# Patient Record
Sex: Female | Born: 2000 | Race: White | Hispanic: No | Marital: Single | Smoking: Never smoker
Health system: Southern US, Community
[De-identification: ages and names within clinical notes are randomized; demographics above are authoritative.]

---
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
## 2018-05-07 IMAGING — CT CT ANGIO CHEST
2 of 6 series · 19 of 46 positions shown · IV contrast (Isovue)
Comparison: Prior radiograph from earlier the same day.

CLINICAL DATA: Initial evaluation for acute chest pain radiating to
left arm. Nausea.

EXAM:
CT ANGIOGRAPHY CHEST WITH CONTRAST
TECHNIQUE: Multidetector CT imaging of the chest was performed using the
standard protocol during bolus administration of intravenous
contrast. Multiplanar CT image reconstructions and MIPs were
obtained to evaluate the vascular anatomy.
CONTRAST:  100 cc of Isovue 370.

[Series 5: thins · axial · 0.64mm/px · z∈[-489,-187]mm · 16 of 332 slices shown]
[im 15/332  lung]
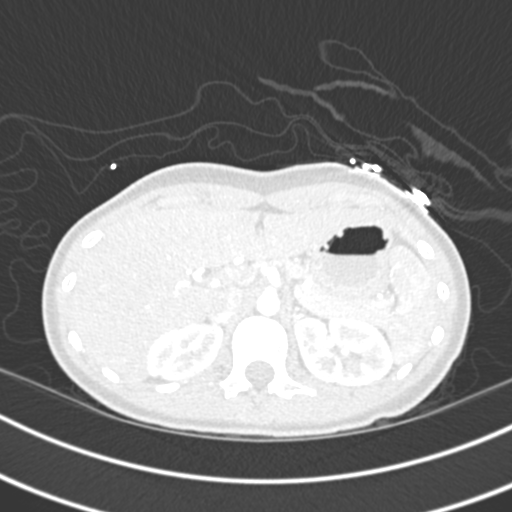
[im 44/332  soft-tissue]
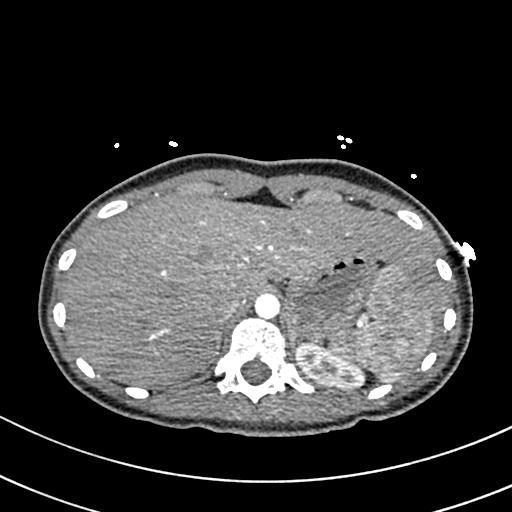
[im 58/332  lung]
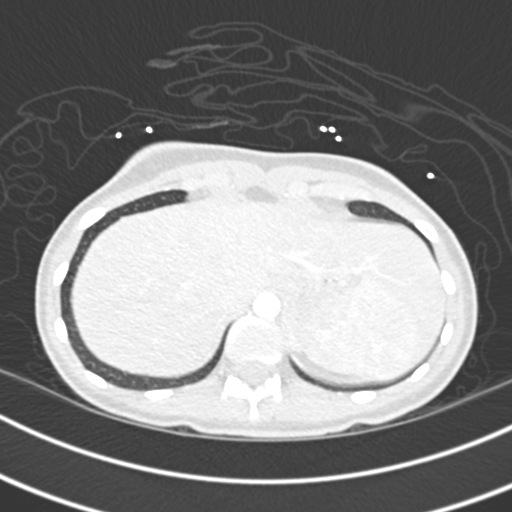
[im 72/332  soft-tissue]
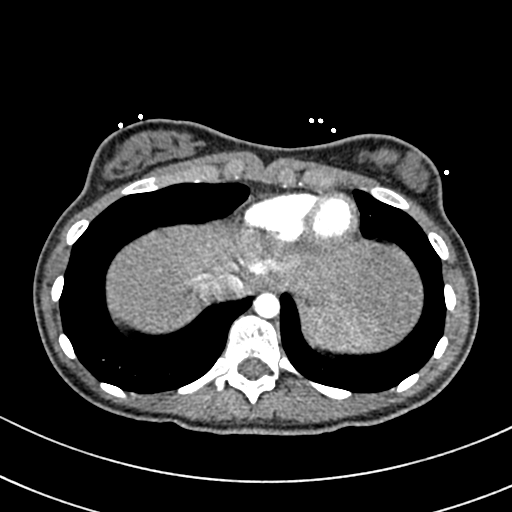
[im 101/332  lung]
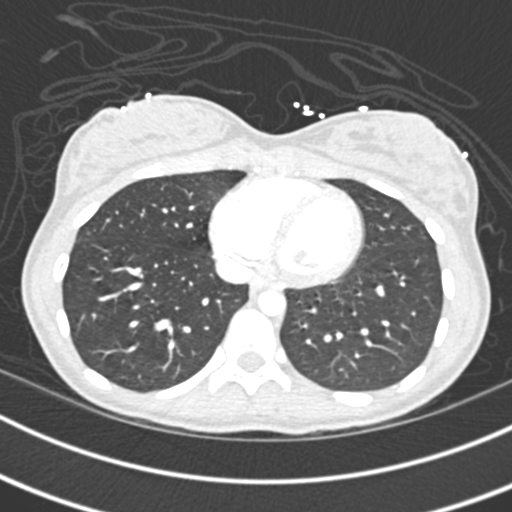
[im 116/332  soft-tissue]
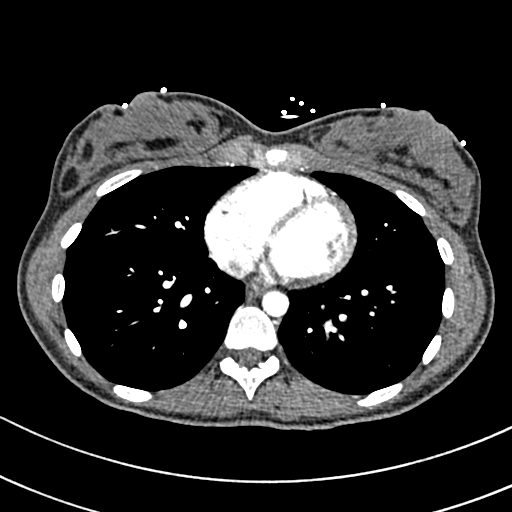
[im 130/332  lung]
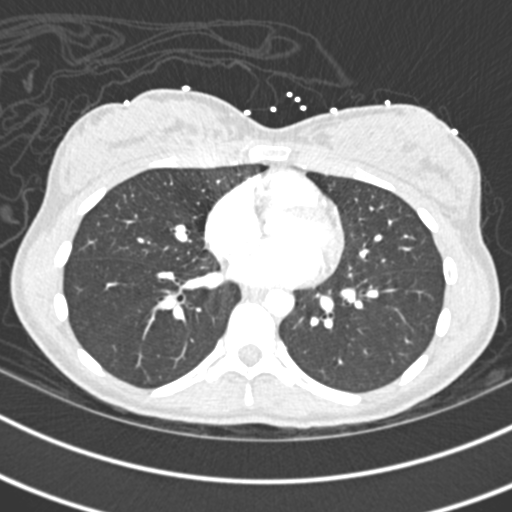
[im 159/332  soft-tissue]
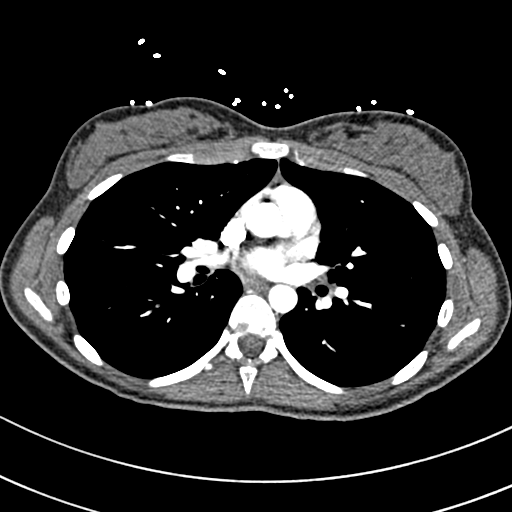
[im 173/332  lung]
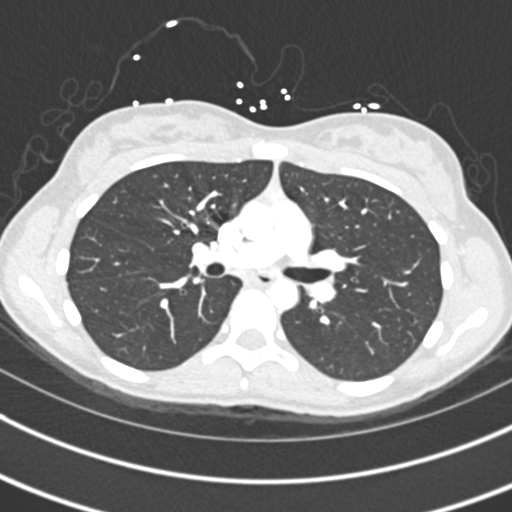
[im 202/332  soft-tissue]
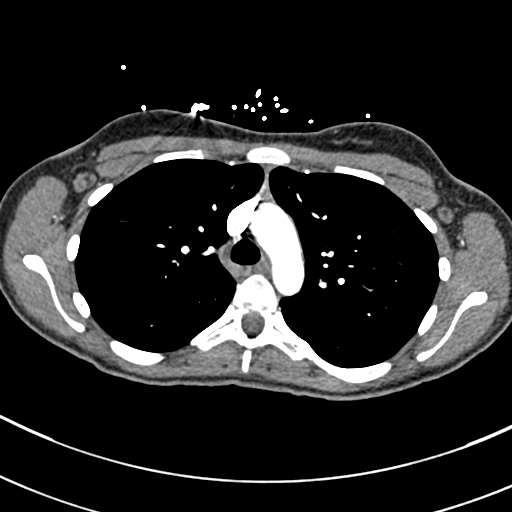
[im 216/332  lung]
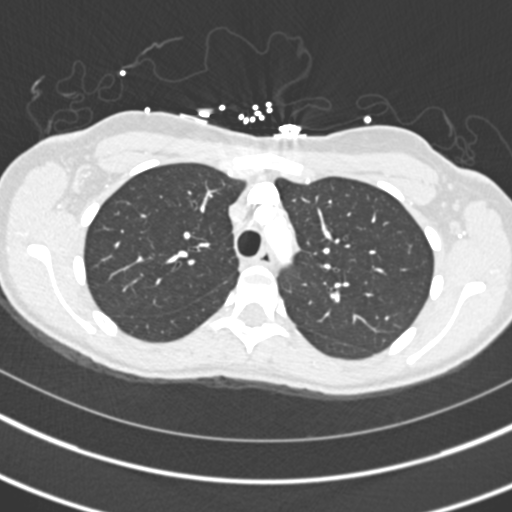
[im 231/332  soft-tissue]
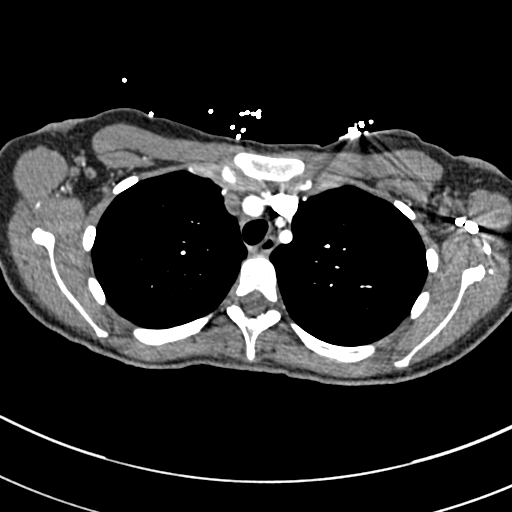
[im 260/332  lung]
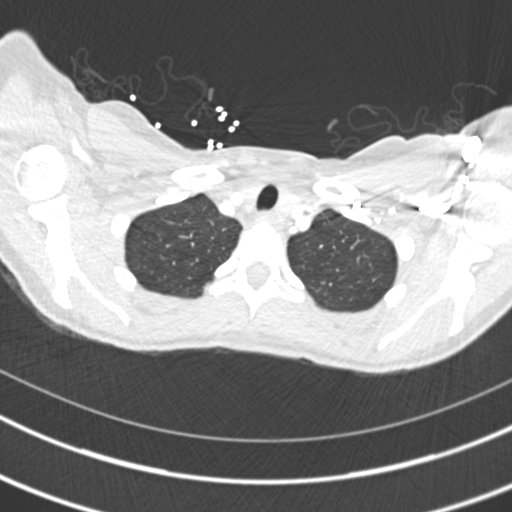
[im 274/332  soft-tissue]
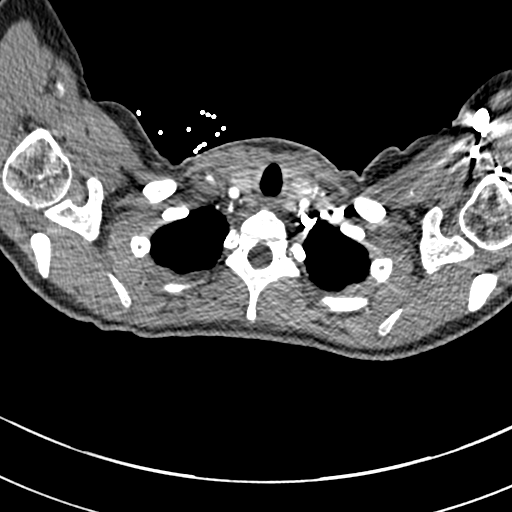
[im 288/332  lung]
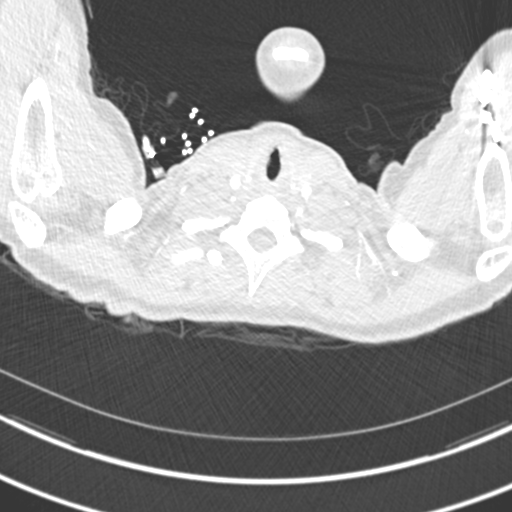
[im 317/332  soft-tissue]
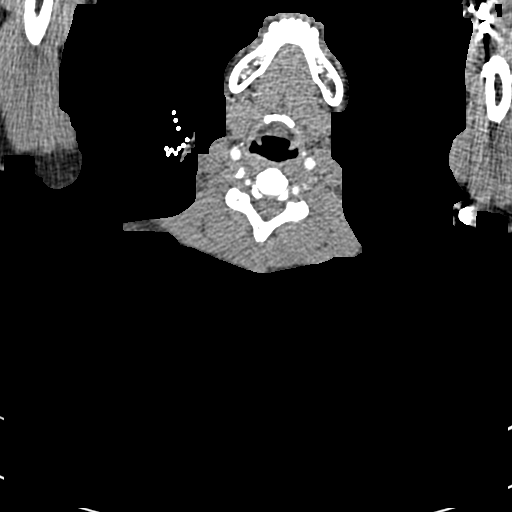

[Series 7: coronal mpr · coronal · 0.67mm/px · 3 of 118 slices shown]
[im 30/118  soft-tissue]
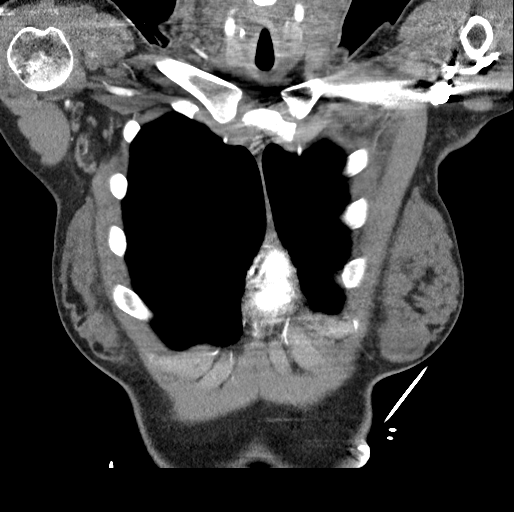
[im 59/118  soft-tissue]
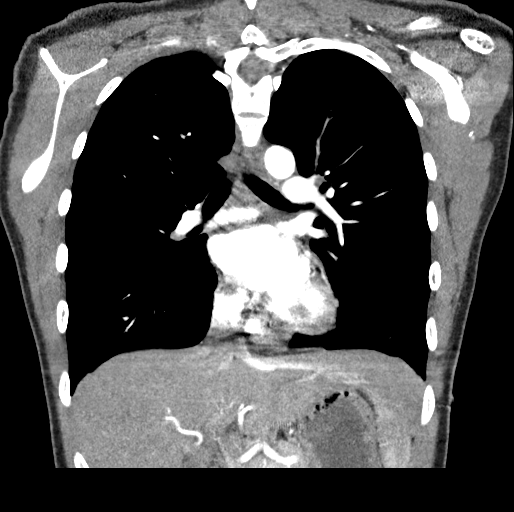
[im 88/118  soft-tissue]
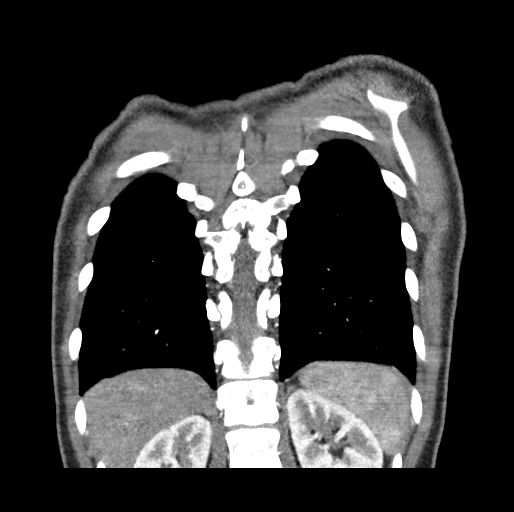

[19 of 46 positions shown; findings below may reference images not displayed]

FINDINGS: Cardiovascular: Intrathoracic aorta of normal caliber and appearance
without aneurysm or acute abnormality. Visualized great vessels
normal.

Heart size normal.  No pericardial effusion.

Pulmonary arterial tree adequately opacified for evaluation. Main
pulmonary artery within normal limits for size. No filling defect to
suggest acute pulmonary embolism. Re-formatted imaging confirms
these findings.

Mediastinum/Nodes: Thyroid normal. No pathologically enlarged
mediastinal, hilar, or axillary lymph nodes identified. Esophagus
within normal limits.

Lungs/Pleura: Tracheobronchial tree is widely patent. Lungs well
inflated and clear bilaterally. No focal infiltrate, pulmonary
edema, or pleural effusion. No pneumothorax. No worrisome pulmonary
nodule or mass.

Upper Abdomen: Visualized upper abdomen unremarkable.

Musculoskeletal: No acute osseous abnormality. No worrisome lytic or
blastic osseous lesions.

Review of the MIP images confirms the above findings.
IMPRESSION: Normal CTA of the chest. No evidence for acute pulmonary embolism.
No other acute cardiopulmonary abnormality identified.

## 2023-08-12 ENCOUNTER — Other Ambulatory Visit (HOSPITAL_COMMUNITY): Payer: Self-pay

## 2023-08-12 ENCOUNTER — Other Ambulatory Visit: Payer: Self-pay

## 2023-08-12 MED ORDER — SERTRALINE HCL 50 MG PO TABS
50.0000 mg | ORAL_TABLET | Freq: Every day | ORAL | 3 refills | Status: DC
  Filled 2023-08-12 (×2): qty 90, 90d supply, fill #0

## 2023-08-12 MED ORDER — LAMOTRIGINE 100 MG PO TABS
100.0000 mg | ORAL_TABLET | Freq: Every day | ORAL | 3 refills | Status: DC
  Filled 2023-08-12: qty 90, 90d supply, fill #0

## 2023-08-12 MED ORDER — VALACYCLOVIR HCL 1 G PO TABS
1.0000 g | ORAL_TABLET | Freq: Every day | ORAL | 2 refills | Status: AC
  Filled 2023-08-12: qty 3, 3d supply, fill #0

## 2023-08-12 MED ORDER — ERGOCALCIFEROL 1.25 MG (50000 UT) PO CAPS
1.0000 | ORAL_CAPSULE | ORAL | 0 refills | Status: AC
  Filled 2023-08-12: qty 24, 84d supply, fill #0

## 2023-08-12 MED ORDER — CARIPRAZINE HCL 3 MG PO CAPS
3.0000 mg | ORAL_CAPSULE | Freq: Every day | ORAL | 3 refills | Status: AC
  Filled 2023-08-12 – 2023-08-20 (×4): qty 90, 90d supply, fill #0

## 2023-08-13 ENCOUNTER — Other Ambulatory Visit: Payer: Self-pay

## 2023-08-13 ENCOUNTER — Other Ambulatory Visit (HOSPITAL_COMMUNITY): Payer: Self-pay

## 2023-08-17 ENCOUNTER — Other Ambulatory Visit (HOSPITAL_COMMUNITY): Payer: Self-pay

## 2023-08-17 MED ORDER — AMPHETAMINE-DEXTROAMPHETAMINE 20 MG PO TABS
20.0000 mg | ORAL_TABLET | Freq: Every day | ORAL | 0 refills | Status: AC
  Filled 2023-08-31: qty 30, 30d supply, fill #0

## 2023-08-17 MED ORDER — AMPHETAMINE-DEXTROAMPHET ER 25 MG PO CP24
25.0000 mg | ORAL_CAPSULE | Freq: Every morning | ORAL | 0 refills | Status: DC
  Filled 2023-08-31: qty 30, 30d supply, fill #0

## 2023-08-18 ENCOUNTER — Other Ambulatory Visit (HOSPITAL_COMMUNITY): Payer: Self-pay

## 2023-08-19 ENCOUNTER — Other Ambulatory Visit (HOSPITAL_COMMUNITY): Payer: Self-pay

## 2023-08-20 ENCOUNTER — Other Ambulatory Visit (HOSPITAL_COMMUNITY): Payer: Self-pay

## 2023-08-20 NOTE — Progress Notes (Signed)
 History:  [name] is a 23 y.o. No obstetric history on file. who presents to clinic today for annual exam, with pap smear and removal of Nexplanon device. Patient reports she does not need contraception because she is in a committed same-sex relationship. She reports that she desires to discontinue Nexplanon because she used it for cycle control and has now started to have break-through bleeding and wants to see what her baseline cycle is.    The following portions of the patient's history were reviewed and updated as appropriate: allergies, current medications, family history, past medical history, social history, past surgical history and problem list.  Review of Systems:  Review of Systems  All other systems reviewed and are negative.     Objective:  Physical Exam BP 120/65   Pulse 67   Wt 125 lb 0.6 oz (56.7 kg)   BMI 21.46 kg/m  Physical Exam Vitals and nursing note reviewed. Exam conducted with a chaperone present.  Constitutional:      Appearance: Normal appearance.  Cardiovascular:     Rate and Rhythm: Normal rate and regular rhythm.     Pulses: Normal pulses.  Pulmonary:     Effort: Pulmonary effort is normal.     Breath sounds: Normal breath sounds.  Chest:  Breasts:    Breasts are symmetrical.     Right: Normal.     Left: Normal.  Abdominal:     General: Abdomen is flat.  Genitourinary:    General: Normal vulva.     Exam position: Lithotomy position.     Vagina: Normal.     Cervix: Normal.     Uterus: Normal.      Adnexa: Right adnexa normal and left adnexa normal.  Musculoskeletal:        General: Normal range of motion.  Skin:    General: Skin is warm and dry.     Capillary Refill: Capillary refill takes less than 2 seconds.  Neurological:     General: No focal deficit present.     Mental Status: She is alert and oriented to person, place, and time.  Psychiatric:        Mood and Affect: Mood normal.        Behavior: Behavior normal.         Thought Content: Thought content normal.        Judgment: Judgment normal.      Labs and Imaging No results found for this or any previous visit (from the past 24 hours).  No results found.   Assessment & Plan:  1. Well woman exam (Primary) Benign   2. Nexplanon removal Patient identified, informed consent performed, consent signed.   Appropriate time out taken. Nexplanon site identified in patient's left arm.  Area prepped in usual sterile fashon. One ml of 1% lidocaine was used to anesthetize the area at the distal end of the implant. A small stab incision was made right beside the implant on the distal portion.  The Nexplanon rod was grasped using hemostats and removed without difficulty. There was minimal blood loss. There were no complications. Steri-strips were applied over the small incision. A pressure bandage was applied to reduce any bruising. The patient tolerated the procedure well and was given post procedure instructions. Patient declines other forms of birth control.  3. Screening for malignant neoplasm of cervix. - Pap smear collected.   Return in 1 year for annual well-woman exam.   Curlie Doughty 08/21/2023 4:53 PM

## 2023-08-21 ENCOUNTER — Ambulatory Visit (INDEPENDENT_AMBULATORY_CARE_PROVIDER_SITE_OTHER): Admitting: Certified Nurse Midwife

## 2023-08-21 ENCOUNTER — Other Ambulatory Visit (HOSPITAL_COMMUNITY)
Admission: RE | Admit: 2023-08-21 | Discharge: 2023-08-21 | Disposition: A | Discharge status: Home / Self Care | Source: Ambulatory Visit | Attending: Certified Nurse Midwife | Admitting: Certified Nurse Midwife

## 2023-08-21 VITALS — BP 120/65 | HR 67 | Wt 125.0 lb

## 2023-08-21 DIAGNOSIS — Z113 Encounter for screening for infections with a predominantly sexual mode of transmission: Secondary | ICD-10-CM | POA: Insufficient documentation

## 2023-08-21 DIAGNOSIS — Z124 Encounter for screening for malignant neoplasm of cervix: Secondary | ICD-10-CM | POA: Insufficient documentation

## 2023-08-21 DIAGNOSIS — Z01419 Encounter for gynecological examination (general) (routine) without abnormal findings: Secondary | ICD-10-CM

## 2023-08-21 DIAGNOSIS — Z3046 Encounter for surveillance of implantable subdermal contraceptive: Secondary | ICD-10-CM

## 2023-08-28 LAB — CYTOLOGY - PAP
Adequacy: ABSENT
Chlamydia: NEGATIVE
Comment: NEGATIVE
Comment: NORMAL
Neisseria Gonorrhea: NEGATIVE

## 2023-08-29 ENCOUNTER — Other Ambulatory Visit (HOSPITAL_COMMUNITY): Payer: Self-pay

## 2023-08-29 ENCOUNTER — Ambulatory Visit: Payer: Self-pay | Admitting: Certified Nurse Midwife

## 2023-08-29 DIAGNOSIS — B379 Candidiasis, unspecified: Secondary | ICD-10-CM

## 2023-08-29 MED ORDER — FLUCONAZOLE 150 MG PO TABS
150.0000 mg | ORAL_TABLET | Freq: Once | ORAL | 0 refills | Status: AC
  Filled 2023-08-29: qty 1, 1d supply, fill #0

## 2023-08-29 NOTE — Progress Notes (Signed)
 Diflucan 150 mg tablet sent to pharmacy per patient request. Message sent to office to get patient scheduled for colposcopy due to pap results LSIL.   Raford Bunk, MSN, CNM, RNC-OB Certified Nurse Midwife, [facility] 08/29/2023 8:21 AM

## 2023-08-31 ENCOUNTER — Other Ambulatory Visit (HOSPITAL_COMMUNITY): Payer: Self-pay

## 2023-08-31 ENCOUNTER — Telehealth: Payer: Self-pay | Admitting: *Deleted

## 2023-08-31 NOTE — Telephone Encounter (Signed)
Left patient a message to call and schedule Colpo. °

## 2023-09-07 ENCOUNTER — Other Ambulatory Visit (HOSPITAL_COMMUNITY)
Admission: RE | Admit: 2023-09-07 | Discharge: 2023-09-07 | Disposition: A | Discharge status: Home / Self Care | Source: Ambulatory Visit | Attending: Obstetrics & Gynecology | Admitting: Obstetrics & Gynecology

## 2023-09-07 ENCOUNTER — Ambulatory Visit: Admitting: Obstetrics & Gynecology

## 2023-09-07 ENCOUNTER — Encounter: Payer: Self-pay | Admitting: Obstetrics & Gynecology

## 2023-09-07 VITALS — BP 117/72 | HR 97 | Ht 63.0 in | Wt 128.0 lb

## 2023-09-07 DIAGNOSIS — R87810 Cervical high risk human papillomavirus (HPV) DNA test positive: Secondary | ICD-10-CM

## 2023-09-07 DIAGNOSIS — R87612 Low grade squamous intraepithelial lesion on cytologic smear of cervix (LGSIL): Secondary | ICD-10-CM | POA: Diagnosis not present

## 2023-09-07 DIAGNOSIS — Z124 Encounter for screening for malignant neoplasm of cervix: Secondary | ICD-10-CM | POA: Insufficient documentation

## 2023-09-07 DIAGNOSIS — N879 Dysplasia of cervix uteri, unspecified: Secondary | ICD-10-CM

## 2023-09-07 DIAGNOSIS — Z3202 Encounter for pregnancy test, result negative: Secondary | ICD-10-CM

## 2023-09-07 LAB — POCT URINE PREGNANCY: Preg Test, Ur: NEGATIVE

## 2023-09-07 NOTE — Progress Notes (Signed)
 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 The prior pap showed no abnormalities.  Prior cervical/vaginal disease: normal exam without visible pathology. Prior cervical treatment: no treatment.  Procedure Details  The risks and benefits of the procedure and Written informed consent obtained.  Speculum placed in vagina and excellent visualization of cervix achieved, cervix swabbed x 3 with acetic acid solution. Lugols also used  Findings: Cervix: acetowhite lesion(s) noted at 12 o'clock and mosaicism noted at 12 o'clock; cervix swabbed with Lugol's solution, SCJ visualized - lesion at 12 o'clock, endocervical curettage performed, cervical biopsies taken at 12 o'clock, specimen labelled and sent to pathology, and hemostasis achieved with Monsel's solution. Vaginal inspection: vaginal colposcopy not performed. Vulvar colposcopy: vulvar colposcopy not performed.  Specimens: cervical biopsy at 12 o'clock and ECC  Complications: none.  Plan: Specimens labelled and sent to Pathology. Will base further treatment on Pathology findings. Post biopsy instructions given to patient. UPT negative

## 2023-09-07 NOTE — Patient Instructions (Signed)
  Place colposcopy patient instructions here.  

## 2023-09-08 LAB — SURGICAL PATHOLOGY

## 2023-09-11 ENCOUNTER — Ambulatory Visit: Payer: Self-pay | Admitting: Obstetrics & Gynecology

## 2023-09-13 ENCOUNTER — Encounter: Payer: Self-pay | Admitting: Obstetrics & Gynecology

## 2023-09-13 DIAGNOSIS — N870 Mild cervical dysplasia: Secondary | ICD-10-CM | POA: Insufficient documentation

## 2023-09-28 ENCOUNTER — Encounter: Payer: Self-pay | Admitting: Obstetrics & Gynecology

## 2023-09-28 ENCOUNTER — Ambulatory Visit: Admitting: Obstetrics & Gynecology

## 2023-09-28 VITALS — BP 125/79 | HR 87 | Ht 63.0 in | Wt 129.0 lb

## 2023-09-28 DIAGNOSIS — N870 Mild cervical dysplasia: Secondary | ICD-10-CM | POA: Diagnosis not present

## 2023-09-28 DIAGNOSIS — Z3009 Encounter for other general counseling and advice on contraception: Secondary | ICD-10-CM
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
## 2023-10-14 ENCOUNTER — Institutional Professional Consult (permissible substitution): Admitting: Obstetrics and Gynecology

## 2023-11-04 ENCOUNTER — Encounter: Payer: Self-pay | Admitting: Obstetrics & Gynecology

## 2023-11-04 ENCOUNTER — Other Ambulatory Visit: Payer: Self-pay | Admitting: Medical Genetics

## 2023-11-10 ENCOUNTER — Other Ambulatory Visit (HOSPITAL_COMMUNITY): Payer: Self-pay

## 2023-11-10 MED ORDER — AMPHETAMINE-DEXTROAMPHET ER 25 MG PO CP24
25.0000 mg | ORAL_CAPSULE | Freq: Every morning | ORAL | 0 refills | Status: DC
  Filled 2023-11-10: qty 30, 30d supply, fill #0

## 2023-11-10 MED ORDER — AMPHETAMINE-DEXTROAMPHETAMINE 20 MG PO TABS
20.0000 mg | ORAL_TABLET | Freq: Every day | ORAL | 0 refills | Status: AC
  Filled 2023-11-10: qty 30, 30d supply, fill #0

## 2023-11-12 ENCOUNTER — Other Ambulatory Visit

## 2023-11-17 ENCOUNTER — Other Ambulatory Visit (HOSPITAL_COMMUNITY): Payer: Self-pay

## 2023-11-17 MED ORDER — AMPHETAMINE-DEXTROAMPHETAMINE 20 MG PO TABS
20.0000 mg | ORAL_TABLET | Freq: Every evening | ORAL | 0 refills | Status: AC
  Filled 2023-11-17 – 2024-03-01 (×3): qty 30, 30d supply, fill #0

## 2023-11-19 ENCOUNTER — Other Ambulatory Visit (HOSPITAL_COMMUNITY): Payer: Self-pay

## 2023-11-23 ENCOUNTER — Institutional Professional Consult (permissible substitution): Admitting: Obstetrics and Gynecology

## 2024-01-08 ENCOUNTER — Other Ambulatory Visit: Payer: Self-pay | Admitting: Medical Genetics

## 2024-01-08 DIAGNOSIS — Z006 Encounter for examination for normal comparison and control in clinical research program: Secondary | ICD-10-CM

## 2024-01-19 ENCOUNTER — Other Ambulatory Visit (HOSPITAL_COMMUNITY): Payer: Self-pay

## 2024-01-19 MED ORDER — AMPHETAMINE-DEXTROAMPHET ER 25 MG PO CP24
25.0000 mg | ORAL_CAPSULE | Freq: Every morning | ORAL | 0 refills | Status: AC
  Filled 2024-01-19 – 2024-03-01 (×2): qty 30, 30d supply, fill #0

## 2024-01-20 ENCOUNTER — Other Ambulatory Visit (HOSPITAL_COMMUNITY): Payer: Self-pay

## 2024-01-20 MED ORDER — SERTRALINE HCL 50 MG PO TABS
50.0000 mg | ORAL_TABLET | Freq: Every day | ORAL | 3 refills | Status: AC
  Filled 2024-01-20 – 2024-03-01 (×2): qty 90, 90d supply, fill #0

## 2024-01-20 MED ORDER — LAMOTRIGINE 100 MG PO TABS
100.0000 mg | ORAL_TABLET | Freq: Every day | ORAL | 3 refills | Status: AC
  Filled 2024-01-20: qty 90, 90d supply, fill #0

## 2024-01-20 MED ORDER — CARIPRAZINE HCL 3 MG PO CAPS
3.0000 mg | ORAL_CAPSULE | Freq: Every evening | ORAL | 3 refills | Status: AC
  Filled 2024-01-20 – 2024-03-01 (×2): qty 90, 90d supply, fill #0

## 2024-01-21 ENCOUNTER — Other Ambulatory Visit (HOSPITAL_COMMUNITY): Payer: Self-pay

## 2024-01-29 ENCOUNTER — Other Ambulatory Visit (HOSPITAL_COMMUNITY): Payer: Self-pay

## 2024-02-02 ENCOUNTER — Other Ambulatory Visit (HOSPITAL_COMMUNITY): Payer: Self-pay

## 2024-03-01 ENCOUNTER — Other Ambulatory Visit (HOSPITAL_COMMUNITY): Payer: Self-pay

## 2024-03-02 ENCOUNTER — Other Ambulatory Visit: Payer: Self-pay

## 2024-03-21 ENCOUNTER — Other Ambulatory Visit: Payer: Self-pay

## 2024-03-21 ENCOUNTER — Other Ambulatory Visit (HOSPITAL_COMMUNITY): Payer: Self-pay

## 2024-03-21 MED ORDER — AMPHETAMINE-DEXTROAMPHETAMINE 20 MG PO TABS
20.0000 mg | ORAL_TABLET | Freq: Every day | ORAL | 0 refills | Status: AC
  Filled 2024-04-01: qty 30, 30d supply, fill #0

## 2024-03-21 MED ORDER — AMPHETAMINE-DEXTROAMPHET ER 25 MG PO CP24
25.0000 mg | ORAL_CAPSULE | Freq: Every morning | ORAL | 0 refills | Status: AC
  Filled 2024-04-01: qty 30, 30d supply, fill #0

## 2024-03-21 MED ORDER — LITHIUM CARBONATE ER 450 MG PO TBCR
450.0000 mg | EXTENDED_RELEASE_TABLET | Freq: Every evening | ORAL | 0 refills | Status: AC
  Filled 2024-03-21: qty 90, 90d supply, fill #0

## 2024-03-28 ENCOUNTER — Other Ambulatory Visit (HOSPITAL_COMMUNITY): Payer: Self-pay

## 2024-03-28 MED ORDER — CAPLYTA 10.5 MG PO CAPS
10.5000 mg | ORAL_CAPSULE | Freq: Every day | ORAL | 1 refills | Status: AC
  Filled 2024-03-28 – 2024-04-04 (×3): qty 30, 30d supply, fill #0

## 2024-04-02 ENCOUNTER — Other Ambulatory Visit (HOSPITAL_BASED_OUTPATIENT_CLINIC_OR_DEPARTMENT_OTHER): Payer: Self-pay

## 2024-04-04 ENCOUNTER — Encounter (HOSPITAL_COMMUNITY): Payer: Self-pay

## 2024-04-04 ENCOUNTER — Other Ambulatory Visit: Payer: Self-pay

## 2024-04-04 ENCOUNTER — Other Ambulatory Visit (HOSPITAL_COMMUNITY): Payer: Self-pay
